# Patient Record
Sex: Female | Born: 1950 | Hispanic: Yes | Marital: Single | Smoking: Never smoker
Health system: Southern US, Community
[De-identification: ages and names within clinical notes are randomized; demographics above are authoritative.]

---

## 2007-03-23 ENCOUNTER — Ambulatory Visit: Payer: Self-pay

## 2008-05-23 ENCOUNTER — Ambulatory Visit: Payer: Self-pay

## 2008-07-02 ENCOUNTER — Ambulatory Visit: Payer: Self-pay

## 2009-08-06 ENCOUNTER — Ambulatory Visit: Payer: Self-pay

## 2010-07-04 IMAGING — MG MAM [PERSON_NAME] DIG SCREEN W/CAD
1 series · 4 of 4 positions shown · non-contrast
Comparison: none

REASON FOR EXAM: annual screening
COMMENTS:

PROCEDURE:     MAM - MAM [REDACTED] AUJLA DIG SCREEN W/CAD  - May 23, 2008 [DATE]
RESULT:       Prominent retroareolar tissue is noted about the LEFT breast
for which compression spot films and if need be ultrasound suggested for
further evaluation. No other abnormalities are identified.

[R CC · right · 4 of 4 slices shown]
[im 1/4]
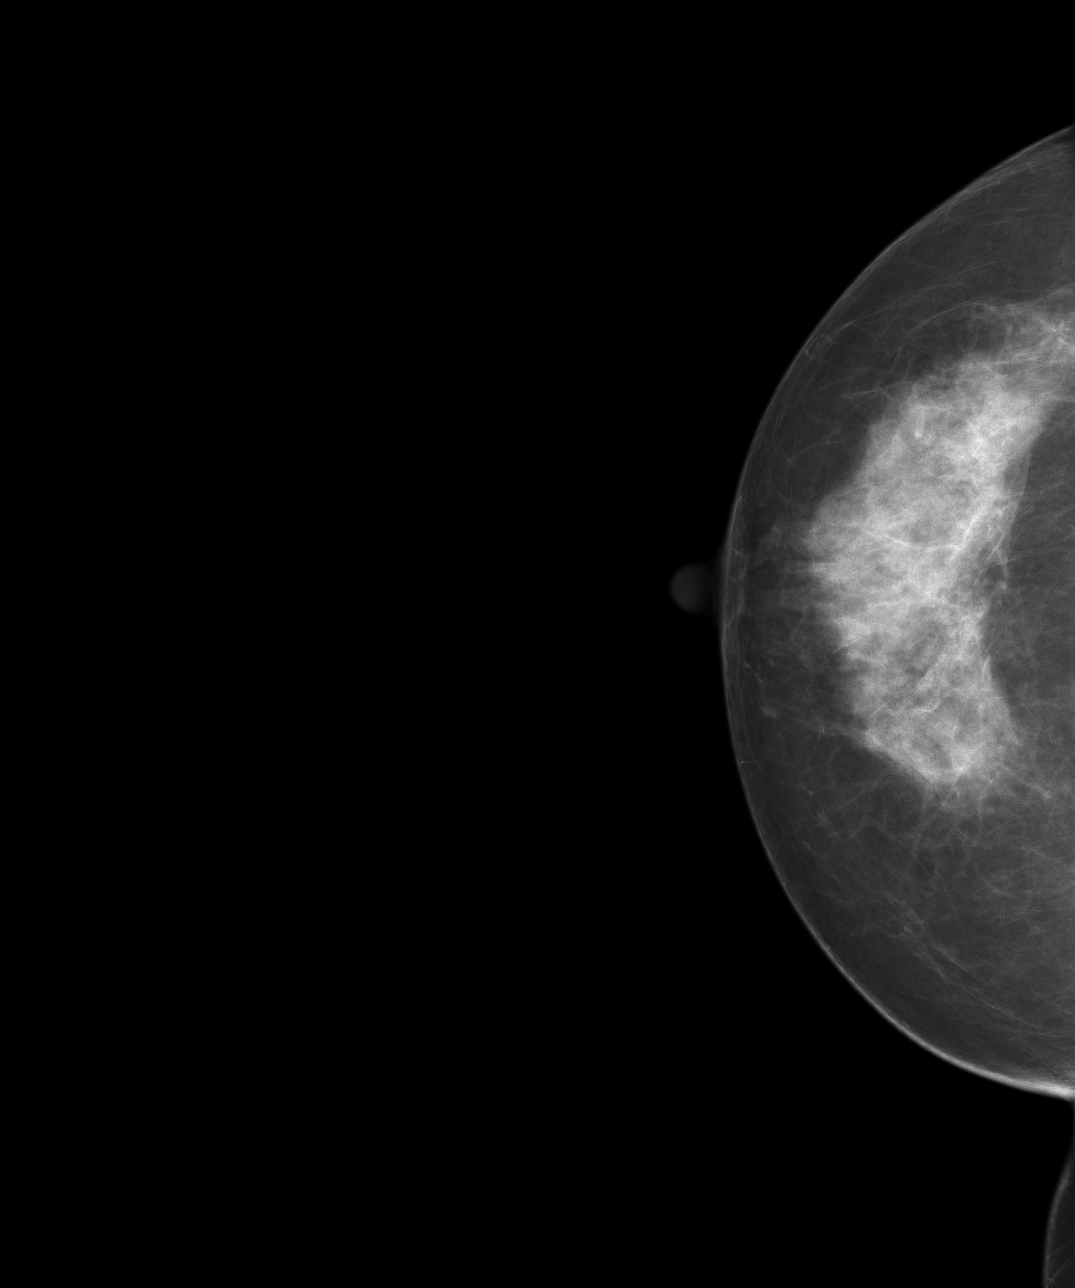
[im 2/4]
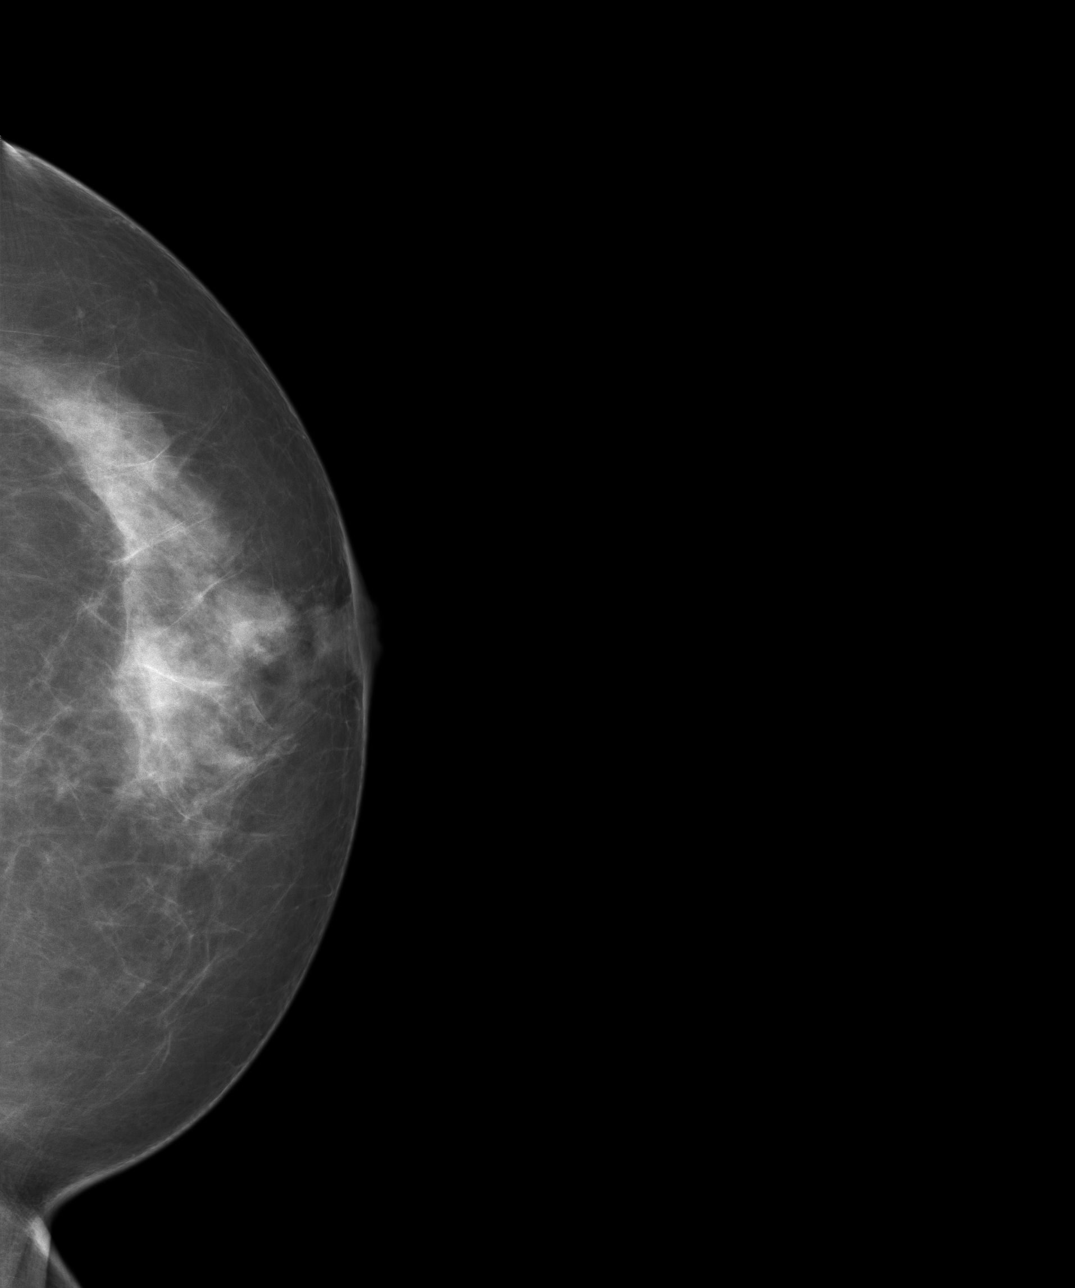
[im 3/4]
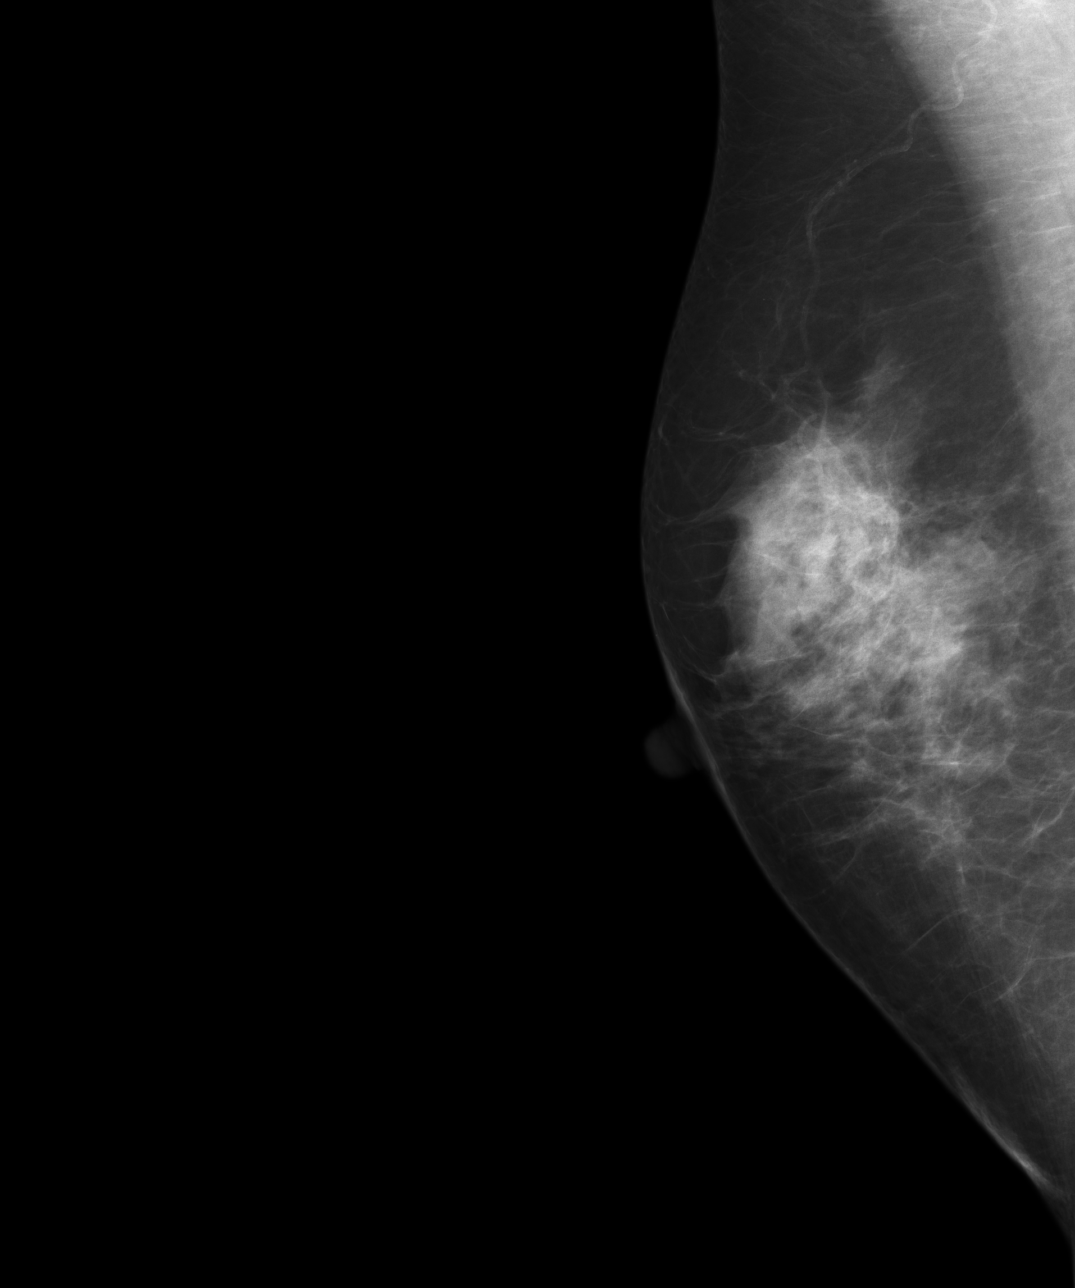
[im 4/4]
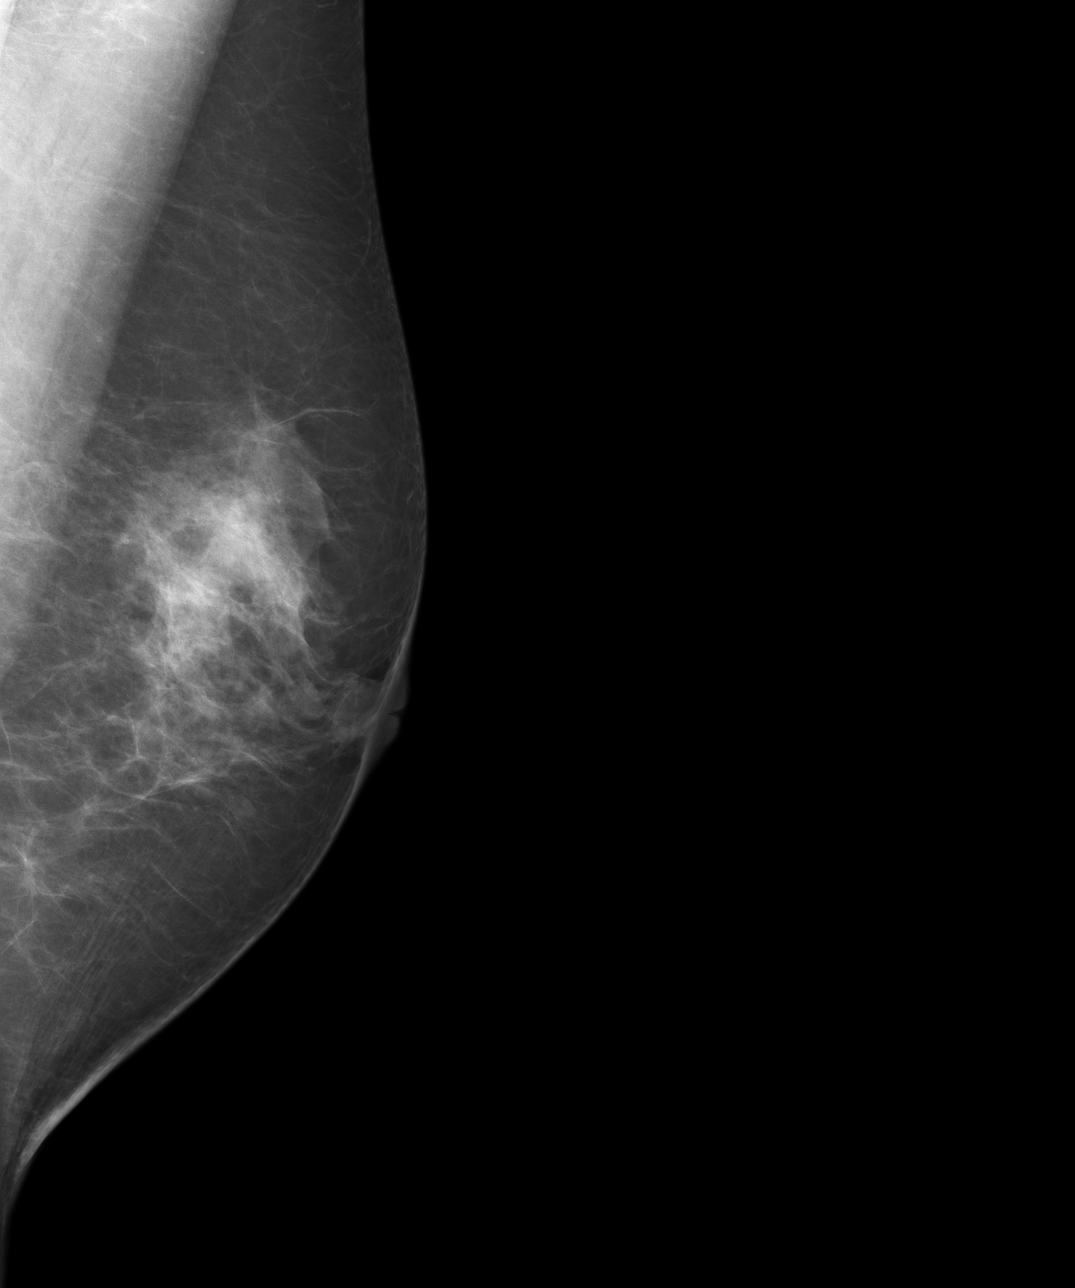

[4 of 4 positions shown; findings below may reference images not displayed]

IMPRESSION: 1.     Prominent retroareolar parenchymal tissue noted on the LEFT for which
compression spot films and if need be ultrasound suggested for further
evaluation.
2.     BI-RADS:  Category 0-Needs Additional Imaging Evaluation.

A NEGATIVE MAMMOGRAM REPORT DOES NOT PRECLUDE BIOPSY OR OTHER EVALUATION OF
A CLINICALLY PALPABLE OR OTHERWISE SUSPICIOUS MASS OR LESION.  BREAST CANCER
MAY NOT BE DETECTED BY MAMMOGRAPHY IN UP TO 10% OF CASES.

## 2011-01-09 ENCOUNTER — Ambulatory Visit: Payer: Self-pay | Admitting: Family Medicine

## 2012-01-26 ENCOUNTER — Ambulatory Visit: Payer: Self-pay

## 2015-07-29 ENCOUNTER — Other Ambulatory Visit: Payer: Self-pay | Admitting: Family Medicine

## 2015-07-29 DIAGNOSIS — Z1231 Encounter for screening mammogram for malignant neoplasm of breast: Secondary | ICD-10-CM

## 2015-08-08 ENCOUNTER — Ambulatory Visit
Admission: RE | Admit: 2015-08-08 | Discharge: 2015-08-08 | Disposition: A | Discharge status: Home / Self Care | Payer: Self-pay | Source: Ambulatory Visit | Attending: Family Medicine | Admitting: Family Medicine

## 2015-08-08 DIAGNOSIS — Z1231 Encounter for screening mammogram for malignant neoplasm of breast: Secondary | ICD-10-CM

## 2016-06-29 ENCOUNTER — Other Ambulatory Visit: Payer: Self-pay | Admitting: Primary Care

## 2016-06-29 DIAGNOSIS — Z1231 Encounter for screening mammogram for malignant neoplasm of breast: Secondary | ICD-10-CM

## 2016-08-11 ENCOUNTER — Ambulatory Visit
Admission: RE | Admit: 2016-08-11 | Discharge: 2016-08-11 | Disposition: A | Discharge status: Home / Self Care | Payer: Self-pay | Source: Ambulatory Visit | Attending: Primary Care | Admitting: Primary Care

## 2016-08-11 DIAGNOSIS — Z1231 Encounter for screening mammogram for malignant neoplasm of breast: Secondary | ICD-10-CM

## 2017-07-27 ENCOUNTER — Other Ambulatory Visit: Payer: Self-pay | Admitting: Family Medicine

## 2017-07-27 DIAGNOSIS — Z1231 Encounter for screening mammogram for malignant neoplasm of breast: Secondary | ICD-10-CM

## 2017-08-20 ENCOUNTER — Ambulatory Visit
Admission: RE | Admit: 2017-08-20 | Discharge: 2017-08-20 | Disposition: A | Discharge status: Home / Self Care | Payer: Self-pay | Source: Ambulatory Visit | Attending: Family Medicine | Admitting: Family Medicine

## 2017-08-20 DIAGNOSIS — Z1231 Encounter for screening mammogram for malignant neoplasm of breast: Secondary | ICD-10-CM

## 2018-09-05 ENCOUNTER — Other Ambulatory Visit: Payer: Self-pay | Admitting: Family Medicine

## 2018-09-05 DIAGNOSIS — Z1231 Encounter for screening mammogram for malignant neoplasm of breast: Secondary | ICD-10-CM

## 2018-09-30 ENCOUNTER — Ambulatory Visit
Admission: RE | Admit: 2018-09-30 | Discharge: 2018-09-30 | Disposition: A | Discharge status: Home / Self Care | Payer: Self-pay | Source: Ambulatory Visit | Attending: Family Medicine | Admitting: Family Medicine

## 2018-09-30 DIAGNOSIS — Z1231 Encounter for screening mammogram for malignant neoplasm of breast: Secondary | ICD-10-CM | POA: Insufficient documentation

## 2020-04-04 ENCOUNTER — Other Ambulatory Visit: Payer: Self-pay | Admitting: Family Medicine

## 2020-04-04 DIAGNOSIS — Z1231 Encounter for screening mammogram for malignant neoplasm of breast: Secondary | ICD-10-CM

## 2020-04-08 ENCOUNTER — Other Ambulatory Visit: Payer: Self-pay

## 2020-04-08 ENCOUNTER — Ambulatory Visit
Admission: RE | Admit: 2020-04-08 | Discharge: 2020-04-08 | Disposition: A | Discharge status: Home / Self Care | Payer: Self-pay | Source: Ambulatory Visit | Attending: Family Medicine | Admitting: Family Medicine

## 2020-04-08 DIAGNOSIS — Z1231 Encounter for screening mammogram for malignant neoplasm of breast: Secondary | ICD-10-CM | POA: Insufficient documentation

## 2020-12-06 ENCOUNTER — Other Ambulatory Visit: Payer: Self-pay | Admitting: Family Medicine

## 2020-12-06 DIAGNOSIS — Z1231 Encounter for screening mammogram for malignant neoplasm of breast: Secondary | ICD-10-CM

## 2021-04-11 ENCOUNTER — Ambulatory Visit
Admission: RE | Admit: 2021-04-11 | Discharge: 2021-04-11 | Disposition: A | Discharge status: Home / Self Care | Payer: Self-pay | Source: Ambulatory Visit | Attending: Family Medicine | Admitting: Family Medicine

## 2021-04-11 ENCOUNTER — Other Ambulatory Visit: Payer: Self-pay

## 2021-04-11 DIAGNOSIS — Z1231 Encounter for screening mammogram for malignant neoplasm of breast: Secondary | ICD-10-CM | POA: Insufficient documentation

## 2021-10-17 ENCOUNTER — Other Ambulatory Visit: Payer: Self-pay

## 2021-10-17 ENCOUNTER — Emergency Department
Admission: EM | Admit: 2021-10-17 | Discharge: 2021-10-17 | Disposition: A | Discharge status: Home / Self Care | Payer: Self-pay | Attending: Emergency Medicine | Admitting: Emergency Medicine

## 2021-10-17 ENCOUNTER — Emergency Department: Payer: Self-pay

## 2021-10-17 DIAGNOSIS — I7100 Dissection of unspecified site of aorta: Secondary | ICD-10-CM | POA: Insufficient documentation

## 2021-10-17 DIAGNOSIS — R1010 Upper abdominal pain, unspecified: Secondary | ICD-10-CM | POA: Insufficient documentation

## 2021-10-17 DIAGNOSIS — Z20822 Contact with and (suspected) exposure to covid-19: Secondary | ICD-10-CM | POA: Insufficient documentation

## 2021-10-17 DIAGNOSIS — R531 Weakness: Secondary | ICD-10-CM | POA: Insufficient documentation

## 2021-10-17 LAB — COMPREHENSIVE METABOLIC PANEL
ALT: 17 U/L (ref 0–44)
AST: 21 U/L (ref 15–41)
Albumin: 3.8 g/dL (ref 3.5–5.0)
Alkaline Phosphatase: 107 U/L (ref 38–126)
Anion gap: 9 (ref 5–15)
BUN: 8 mg/dL (ref 8–23)
CO2: 24 mmol/L (ref 22–32)
Calcium: 8.8 mg/dL — ABNORMAL LOW (ref 8.9–10.3)
Chloride: 106 mmol/L (ref 98–111)
Creatinine, Ser: 0.4 mg/dL — ABNORMAL LOW (ref 0.44–1.00)
GFR, Estimated: >60 mL/min (ref >60)
Glucose, Bld: 127 mg/dL — ABNORMAL HIGH (ref 70–99)
Potassium: 3.3 mmol/L — ABNORMAL LOW (ref 3.5–5.1)
Sodium: 139 mmol/L (ref 135–145)
Total Bilirubin: 0.4 mg/dL (ref 0.3–1.2)
Total Protein: 6.5 g/dL (ref 6.5–8.1)

## 2021-10-17 LAB — PROTIME-INR
INR: 1 (ref 0.8–1.2)
Prothrombin Time: 13.2 seconds (ref 11.4–15.2)

## 2021-10-17 LAB — RESP PANEL BY RT-PCR (FLU A&B, COVID) ARPGX2
Influenza A by PCR: NEGATIVE
Influenza B by PCR: NEGATIVE
SARS Coronavirus 2 by RT PCR: NEGATIVE

## 2021-10-17 LAB — CBC WITH DIFFERENTIAL/PLATELET
Abs Immature Granulocytes: 0.03 10*3/uL (ref 0.00–0.07)
Basophils Absolute: 0 10*3/uL (ref 0.0–0.1)
Basophils Relative: 0 %
Eosinophils Absolute: 0.1 10*3/uL (ref 0.0–0.5)
Eosinophils Relative: 1 %
HCT: 40.9 % (ref 36.0–46.0)
Hemoglobin: 13.6 g/dL (ref 12.0–15.0)
Immature Granulocytes: 0 %
Lymphocytes Relative: 50 %
Lymphs Abs: 4.7 10*3/uL — ABNORMAL HIGH (ref 0.7–4.0)
MCH: 31.4 pg (ref 26.0–34.0)
MCHC: 33.3 g/dL (ref 30.0–36.0)
MCV: 94.5 fL (ref 80.0–100.0)
Monocytes Absolute: 0.9 10*3/uL (ref 0.1–1.0)
Monocytes Relative: 9 %
Neutro Abs: 3.8 10*3/uL (ref 1.7–7.7)
Neutrophils Relative %: 40 %
Platelets: 277 10*3/uL (ref 150–400)
RBC: 4.33 MIL/uL (ref 3.87–5.11)
RDW: 11.9 % (ref 11.5–15.5)
WBC: 9.5 10*3/uL (ref 4.0–10.5)
nRBC: 0 % (ref 0.0–0.2)

## 2021-10-17 LAB — TYPE AND SCREEN
ABO/RH(D): A POS
Antibody Screen: NEGATIVE

## 2021-10-17 LAB — TROPONIN I (HIGH SENSITIVITY)
Troponin I (High Sensitivity): 3 ng/L (ref <18)
Troponin I (High Sensitivity): 3 ng/L (ref <18)

## 2021-10-17 LAB — CBG MONITORING, ED: Glucose-Capillary: 105 mg/dL — ABNORMAL HIGH (ref 70–99)

## 2021-10-17 LAB — LIPASE, BLOOD: Lipase: 30 U/L (ref 11–51)

## 2021-10-17 MED ORDER — IOHEXOL 350 MG/ML SOLN
80.0000 mL | Freq: Once | INTRAVENOUS | Status: AC | PRN
  Administered 2021-10-17: 80 mL via INTRAVENOUS

## 2021-10-17 MED ORDER — ONDANSETRON HCL 4 MG/2ML IJ SOLN
4.0000 mg | Freq: Once | INTRAMUSCULAR | Status: AC
  Administered 2021-10-17: 4 mg via INTRAVENOUS
  Filled 2021-10-17: qty 2

## 2021-10-17 MED ORDER — FENTANYL CITRATE PF 50 MCG/ML IJ SOSY
50.0000 ug | PREFILLED_SYRINGE | Freq: Once | INTRAMUSCULAR | Status: AC
  Administered 2021-10-17: 50 ug via INTRAVENOUS
  Filled 2021-10-17: qty 1

## 2021-10-17 MED ORDER — POTASSIUM CHLORIDE CRYS ER 20 MEQ PO TBCR
40.0000 meq | EXTENDED_RELEASE_TABLET | Freq: Once | ORAL | Status: AC
  Administered 2021-10-17: 40 meq via ORAL
  Filled 2021-10-17: qty 2

## 2021-10-17 MED ORDER — PANTOPRAZOLE 80MG IVPB - SIMPLE MED
80.0000 mg | Freq: Once | INTRAVENOUS | Status: AC
  Administered 2021-10-17: 80 mg via INTRAVENOUS
  Filled 2021-10-17: qty 100

## 2021-10-17 MED ORDER — LACTATED RINGERS IV SOLN: INTRAVENOUS | Status: DC

## 2021-10-17 MED ORDER — LIDOCAINE VISCOUS HCL 2 % MT SOLN
15.0000 mL | Freq: Once | OROMUCOSAL | Status: AC
  Administered 2021-10-17: 15 mL via ORAL
  Filled 2021-10-17: qty 15

## 2021-10-17 MED ORDER — ALUM & MAG HYDROXIDE-SIMETH 200-200-20 MG/5ML PO SUSP
30.0000 mL | Freq: Once | ORAL | Status: AC
Start: 2021-10-17 — End: 2021-10-17
  Administered 2021-10-17: 30 mL via ORAL
  Filled 2021-10-17: qty 30

## 2021-10-17 NOTE — Discharge Instructions (Signed)
As we discussed you need to follow up with your doctor to evaluate your thyroid nodule and possibly have an MRI of your liver

## 2021-10-17 NOTE — ED Notes (Signed)
Pt given potassium at 8:17 am. The pt was unable to swallow pills so meds were crushed.

## 2021-10-17 NOTE — ED Triage Notes (Addendum)
Pt here for c/o epigastric abdominal pain and woke up today with weakness.  Denies fever, diarrhea at this time. Reports PMHX hyperlipidemia.  Glucose=105 on arrival .  Pt presents to ED AA, spanish speaking. Appears pale looking, diaphoretic and weak. ERMD at bedside at this time . [employer] Interpreter [phone]

## 2021-10-17 NOTE — ED Provider Notes (Signed)
[facility] Provider Note    Event Date/Time   First MD Initiated Contact with Patient 10/17/21 [phone]     (approximate)   History   Abdominal Pain and Weakness   HPI  Monica Bryant is a 71 y.o. female with history of hyperlipidemia, previous cholecystectomy who presents to the emergency department with her son with complaints of severe onset upper abdominal pain that started this morning.  She is feeling very weak as well.  No fevers, nausea, vomiting, diarrhea, bloody stools, melena.  No vaginal bleeding, dysuria or hematuria.  Has never had similar symptoms.  Denies chest pain or shortness of breath.  Lives with her son.   History provided by patient and son using Spanish interpreter.    No past medical history on file.  No past surgical history on file.  MEDICATIONS:  Prior to Admission medications   Not on File    Physical Exam   Triage Vital Signs: ED Triage Vitals  Enc Vitals Group     BP 10/17/21 0559 (!) 118/54     Pulse Rate 10/17/21 0559 69     Resp 10/17/21 0559 15     Temp 10/17/21 0601 97.8 F (36.6 C)     Temp Source 10/17/21 0601 Oral     SpO2 10/17/21 0559 96 %     Weight 10/17/21 0602 122 lb 12.7 oz (55.7 kg)     Height 10/17/21 0602 5' (1.524 m)     Head Circumference --      Peak Flow --      Pain Score --      Pain Loc --      Pain Edu? --      Excl. in [place]? --     Most recent vital signs: Vitals:   10/17/21 0601 10/17/21 0630  BP:  113/70  Pulse:  64  Resp:  12  Temp: 97.8 F (36.6 C)   SpO2:  100%    CONSTITUTIONAL: Alert and oriented and responds appropriately to questions.  Elderly, pale, appears uncomfortable, slightly diaphoretic HEAD: Normocephalic, atraumatic EYES: Conjunctivae clear, pupils appear equal, sclera nonicteric, patient has conjunctival pallor ENT: normal nose; moist mucous membranes NECK: Supple, normal ROM CARD: RRR; S1 and S2 appreciated; no murmurs, no clicks, no rubs, no  gallops RESP: Normal chest excursion without splinting or tachypnea; breath sounds clear and equal bilaterally; no wheezes, no rhonchi, no rales, no hypoxia or respiratory distress, speaking full sentences ABD/GI: Normal bowel sounds; non-distended; soft, tender to palpation in the upper abdomen without guarding or rebound RECTAL:  Normal rectal tone, no gross blood or melena, guaiac NEGATIVE, stool appears normal brown color, no hemorrhoids appreciated, nontender rectal exam, no fecal impaction. Chaperone present. BACK: The back appears normal EXT: Normal ROM in all joints; no deformity noted, no edema; no cyanosis, no calf tenderness or calf swelling, 2+ DP and radial pulses bilaterally SKIN: Normal color for age and race; warm; no rash on exposed skin NEURO: Moves all extremities equally, normal speech PSYCH: The patient's mood and manner are appropriate.   ED Results / Procedures / Treatments   LABS: (all labs ordered are listed, but only abnormal results are displayed) Labs Reviewed  CBC WITH DIFFERENTIAL/PLATELET - Abnormal; Notable for the following components:      Result Value   Lymphs Abs 4.7 (*)    All other components within normal limits  COMPREHENSIVE METABOLIC PANEL - Abnormal; Notable for the following components:   Potassium 3.3 (*)  Glucose, Bld 127 (*)    Creatinine, Ser 0.40 (*)    Calcium 8.8 (*)    All other components within normal limits  CBG MONITORING, ED - Abnormal; Notable for the following components:   Glucose-Capillary 105 (*)    All other components within normal limits  RESP PANEL BY RT-PCR (FLU A&B, COVID) ARPGX2  LIPASE, BLOOD  PROTIME-INR  TYPE AND SCREEN  TROPONIN I (HIGH SENSITIVITY)     EKG:  EKG Interpretation  Date/Time:  Friday October 17 2021 06:02:13 EST Ventricular Rate:  64 PR Interval:  163 QRS Duration: 100 QT Interval:  408 QTC Calculation: 421 R Axis:   70 Text Interpretation: Sinus rhythm Abnormal R-wave progression,  early transition Confirmed by Pryor Curia [phone]) on 10/17/2021 6:29:48 AM         RADIOLOGY: My personal review and interpretation of imaging: CT dissection study pending.  I have personally reviewed all radiology reports.   No results found.   PROCEDURES:  Critical Care performed: No   CRITICAL CARE Performed by: Pryor Curia   Total critical care time: 0 minutes  Critical care time was exclusive of separately billable procedures and treating other patients.  Critical care was necessary to treat or prevent imminent or life-threatening deterioration.  Critical care was time spent personally by me on the following activities: development of treatment plan with patient and/or surrogate as well as nursing, discussions with consultants, evaluation of patient's response to treatment, examination of patient, obtaining history from patient or surrogate, ordering and performing treatments and interventions, ordering and review of laboratory studies, ordering and review of radiographic studies, pulse oximetry and re-evaluation of patient's condition.   Marland Kitchen1-3 Lead EKG Interpretation Performed by: Nafeesah Lapaglia, Delice Bison, DO Authorized by: Ajamu Maxon, Delice Bison, DO     Interpretation: normal     ECG rate:  75   ECG rate assessment: normal     Rhythm: sinus rhythm     Ectopy: none     Conduction: normal      IMPRESSION / MDM / ASSESSMENT AND PLAN / ED COURSE  I reviewed the triage vital signs and the nursing notes.    Patient here with upper abdominal pain.  Appears pale and uncomfortable on exam.  The patient is on the cardiac monitor to evaluate for evidence of arrhythmia and/or significant heart rate changes.   DIFFERENTIAL DIAGNOSIS (includes but not limited to):   Dissection, peptic ulcer disease, GI bleed, anemia, ACS, PE, colitis, GERD, duodenitis, choledocholithiasis, pancreatitis   PLAN: We will obtain CBC, coags, CMP, lipase, troponin.  EKG nonischemic.  She is guaiac  negative here.  Will obtain CT dissection study.  Will give Protonix, fentanyl, Zofran, IV fluids.  Patient on cardiac monitoring.   MEDICATIONS GIVEN IN ED: Medications  lactated ringers infusion ( Intravenous New Bag/Given 10/17/21 0618)  potassium chloride SA (KLOR-CON M) CR tablet 40 mEq (has no administration in time range)  pantoprazole (PROTONIX) 80 mg /NS 100 mL IVPB (0 mg Intravenous Stopped 10/17/21 0649)  fentaNYL (SUBLIMAZE) injection 50 mcg (50 mcg Intravenous Given 10/17/21 0618)  ondansetron (ZOFRAN) injection 4 mg (4 mg Intravenous Given 10/17/21 0618)  alum & mag hydroxide-simeth (MAALOX/MYLANTA) 200-200-20 MG/5ML suspension 30 mL (30 mLs Oral Given 10/17/21 0649)    And  lidocaine (XYLOCAINE) 2 % viscous mouth solution 15 mL (15 mLs Oral Given 10/17/21 0649)  iohexol (OMNIPAQUE) 350 MG/ML injection 80 mL (80 mLs Intravenous Contrast Given 10/17/21 0704)     ED COURSE: Patient's hemoglobin  is normal.  Potassium slightly low at 3.3.  Will replace.  Normal LFTs, lipase.  Troponin x 1 negative.  COVID and flu swabs negative.  CT dissection study pending.  Signed out the oncoming ED physician.  I reviewed all nursing notes and pertinent previous records as available.  I have reviewed and interpreted any and all EKGs, lab and urine results, imaging and radiology reports (as available).    CONSULTS: Dispo pending once results complete.   OUTSIDE RECORDS REVIEWED: Reviewed patient's colonoscopy at [facility] on 03/12/2021.  Patient was found to have internal hemorrhoids.  She had 25 subcentimeter polyps throughout the colon that were all resected.         FINAL CLINICAL IMPRESSION(S) / ED DIAGNOSES   Final diagnoses:  Upper abdominal pain     Rx / DC Orders   ED Discharge Orders     None        Note:  This document was prepared using Dragon voice recognition software and may include unintentional dictation errors.   Lamonda Noxon, Delice Bison, DO 10/17/21 [phone]

## 2021-10-17 NOTE — ED Notes (Addendum)
[employer] language interpreter Sol Blazing [phone] utilized to inform and explain to pt and family at bedside medications given

## 2021-10-17 NOTE — ED Provider Notes (Signed)
Patient CT scan is reassuring, discussed thyroid nodule and need for outpatient liver MRI with the patient and family using interpreter  She is asymptomatic and feeling well at this time, appropriate for discharge   Jene Every, MD 10/17/21 [phone]

## 2021-10-17 NOTE — ED Notes (Signed)
Rounded to see patient. Used interpreter on stick to introduce self and plan for the morning. Pt CAOx4 and in no distress. Family at bedside.

## 2021-11-13 ENCOUNTER — Encounter (HOSPITAL_COMMUNITY): Payer: Self-pay | Admitting: Radiology

## 2022-07-22 ENCOUNTER — Other Ambulatory Visit: Payer: Self-pay

## 2022-07-22 DIAGNOSIS — Z1231 Encounter for screening mammogram for malignant neoplasm of breast: Secondary | ICD-10-CM

## 2022-07-27 ENCOUNTER — Ambulatory Visit
Admission: RE | Admit: 2022-07-27 | Discharge: 2022-07-27 | Disposition: A | Discharge status: Home / Self Care | Payer: Self-pay | Source: Ambulatory Visit | Attending: Obstetrics and Gynecology | Admitting: Obstetrics and Gynecology

## 2022-07-27 ENCOUNTER — Ambulatory Visit: Payer: Self-pay | Attending: Hematology and Oncology | Admitting: Hematology and Oncology

## 2022-07-27 VITALS — BP 154/72 | Wt 117.3 lb

## 2022-07-27 DIAGNOSIS — Z1231 Encounter for screening mammogram for malignant neoplasm of breast: Secondary | ICD-10-CM

## 2022-07-27 NOTE — Progress Notes (Signed)
Monica Bryant is a 71 y.o. female who presents to [facility] clinic today with no complaints.    Pap Smear: Pap not smear completed today. Last Pap smear was 2017 at [facility] clinic and was normal. Per patient has no history of an abnormal Pap smear. Last Pap smear result is available in Epic. She declines Pap today; due to age, wishes to not have anymore.    Physical exam: Breasts Breasts symmetrical. No skin abnormalities bilateral breasts. No nipple retraction bilateral breasts. No nipple discharge bilateral breasts. No lymphadenopathy. No lumps palpated bilateral breasts.     MM 3D SCREEN BREAST BILATERAL  Result Date: 04/14/2021 CLINICAL DATA:  Screening. EXAM: DIGITAL SCREENING BILATERAL MAMMOGRAM WITH TOMOSYNTHESIS AND CAD TECHNIQUE: Bilateral screening digital craniocaudal and mediolateral oblique mammograms were obtained. Bilateral screening digital breast tomosynthesis was performed. The images were evaluated with computer-aided detection. COMPARISON:  Previous exam(s). ACR Breast Density Category c: The breast tissue is heterogeneously dense, which may obscure small masses. FINDINGS: There are no findings suspicious for malignancy. IMPRESSION: No mammographic evidence of malignancy. A result letter of this screening mammogram will be mailed directly to the patient. RECOMMENDATION: Screening mammogram in one year. (Code:SM-B-01Y) BI-RADS CATEGORY  1: Negative. Electronically Signed   By: Baird Lyons M.D.   On: 04/14/2021 13:29  MM 3D SCREEN BREAST BILATERAL  Result Date: 04/09/2020 CLINICAL DATA:  Screening. EXAM: DIGITAL SCREENING BILATERAL MAMMOGRAM WITH TOMO AND CAD COMPARISON:  Previous exam(s). ACR Breast Density Category c: The breast tissue is heterogeneously dense, which may obscure small masses. FINDINGS: There are no findings suspicious for malignancy. Images were processed with CAD. IMPRESSION: No mammographic evidence of malignancy. A result letter of this screening  mammogram will be mailed directly to the patient. RECOMMENDATION: Screening mammogram in one year. (Code:SM-B-01Y) BI-RADS CATEGORY  1: Negative. Electronically Signed   By: Emmaline Kluver M.D.   On: 04/09/2020 14:04   MM 3D SCREEN BREAST BILATERAL  Result Date: 09/30/2018 CLINICAL DATA:  Screening. EXAM: DIGITAL SCREENING BILATERAL MAMMOGRAM WITH TOMO AND CAD COMPARISON:  Previous exam(s). ACR Breast Density Category b: There are scattered areas of fibroglandular density. FINDINGS: There are no findings suspicious for malignancy. Images were processed with CAD. IMPRESSION: No mammographic evidence of malignancy. A result letter of this screening mammogram will be mailed directly to the patient. RECOMMENDATION: Screening mammogram in one year. (Code:SM-B-01Y) BI-RADS CATEGORY  1: Negative. Electronically Signed   By: Ted Mcalpine M.D.   On: 09/30/2018 13:08   MM DIGITAL SCREENING BILATERAL  Result Date: 08/23/2017 CLINICAL DATA:  Screening. EXAM: DIGITAL SCREENING BILATERAL MAMMOGRAM WITH CAD COMPARISON:  Previous exam(s). ACR Breast Density Category c: The breast tissue is heterogeneously dense, which may obscure small masses. FINDINGS: There are no findings suspicious for malignancy. Images were processed with CAD. IMPRESSION: No mammographic evidence of malignancy. A result letter of this screening mammogram will be mailed directly to the patient. RECOMMENDATION: Screening mammogram in one year. (Code:SM-B-01Y) BI-RADS CATEGORY  1: Negative. Electronically Signed   By: Ted Mcalpine M.D.   On: 08/23/2017 17:26      Pelvic/Bimanual Pap is not indicated today    Smoking History: Patient has never smoked and was not referred to quit line.    Patient Navigation: Patient education provided. Access to services provided for patient through [facility] program. Karolee Stamps interpreter provided. No transportation provided   Colorectal Cancer Screening: Per patient has had colonoscopy  completed on 2022 with no evidence of high-grade dysplasia on polypectomy.  No complaints today.    Breast and Cervical Cancer Risk Assessment: Patient does not have family history of breast cancer, known genetic mutations, or radiation treatment to the chest before age 47. Patient does not have history of cervical dysplasia, immunocompromised, or DES exposure in-utero.  Risk Assessment   No risk assessment data     A: [facility] exam without pap smear No complaints with benign exam.   P: [facility]. Appointment scheduled 07/27/22.  Pascal Lux, NP 07/27/2022 9:53 AM

## 2022-07-27 NOTE — Patient Instructions (Signed)
Taught Monica Bryant about self breast awareness. Patient did not need a Pap smear today due to last Pap smear was in 2017 per patient; she declines anymore Paps due to age and discomfort. [facility]. Appointment scheduled for 07/27/22. Patient aware of appointment and will be there. Let patient know will follow up with her within the next couple weeks with results. Monica Bryant [place] verbalized understanding.  Monica Lux, NP 9:56 AM

## 2023-06-18 ENCOUNTER — Telehealth: Payer: Self-pay | Admitting: *Deleted

## 2023-07-02 ENCOUNTER — Other Ambulatory Visit: Payer: Self-pay | Admitting: Family Medicine

## 2023-07-02 DIAGNOSIS — Z1231 Encounter for screening mammogram for malignant neoplasm of breast: Secondary | ICD-10-CM

## 2023-07-29 ENCOUNTER — Ambulatory Visit
Admission: RE | Admit: 2023-07-29 | Discharge: 2023-07-29 | Disposition: A | Discharge status: Home / Self Care | Payer: Self-pay | Source: Ambulatory Visit | Attending: Family Medicine | Admitting: Family Medicine

## 2023-07-29 DIAGNOSIS — Z1231 Encounter for screening mammogram for malignant neoplasm of breast: Secondary | ICD-10-CM | POA: Insufficient documentation

## 2023-11-28 IMAGING — CT CT ANGIO CHEST-ABD-PELV FOR DISSECTION W/ AND WO/W CM
2 of 7 series · 14 of 46 positions shown, 16 images · IV contrast (APPLIED)
Comparison: None available

CLINICAL DATA: Severe upper abdominal pain that started this
morning, acute aortic syndrome suspected.

EXAM:
CT ANGIOGRAPHY CHEST, ABDOMEN AND PELVIS
TECHNIQUE: Non-contrast CT of the chest was initially obtained.

[Series 5: axial arterial · axial · arterial · 0.84mm/px · z∈[-628,-68]mm · 11 of 215 slices shown, 13 images]
[im 14/215  soft-tissue]
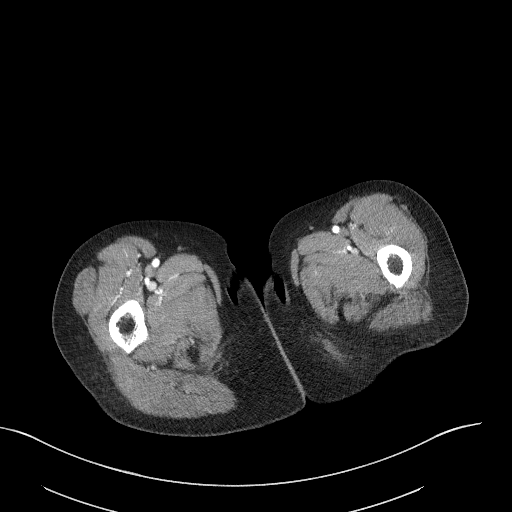
[im 14/215  bone]
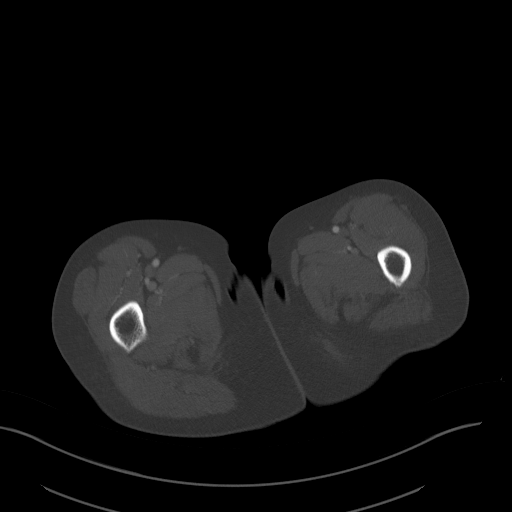
[im 41/215  soft-tissue]
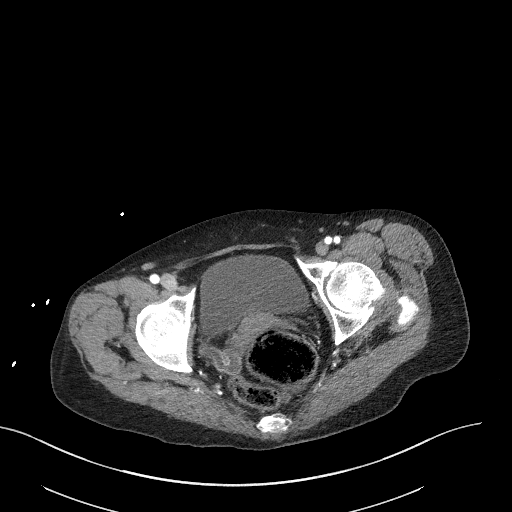
[im 54/215  soft-tissue]
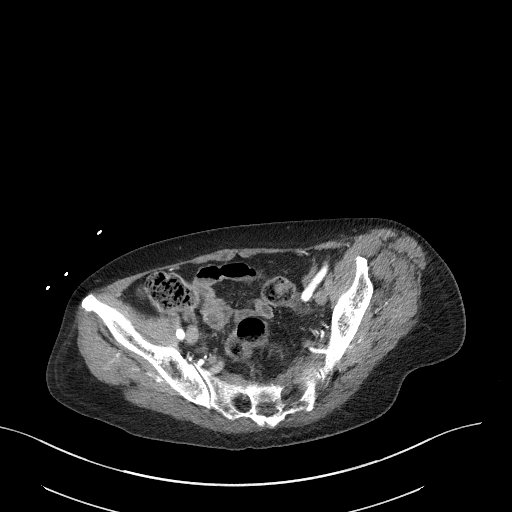
[im 67/215  soft-tissue]
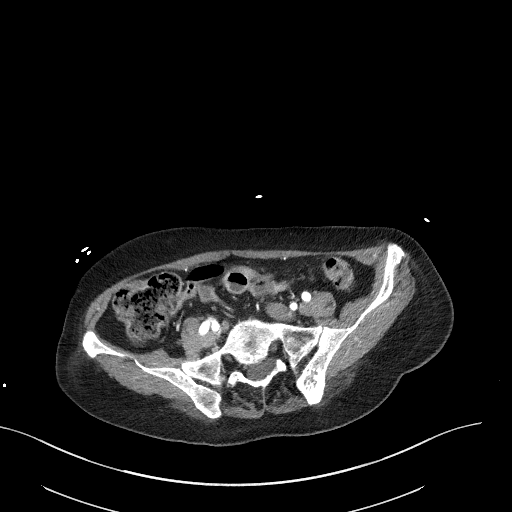
[im 94/215  soft-tissue]
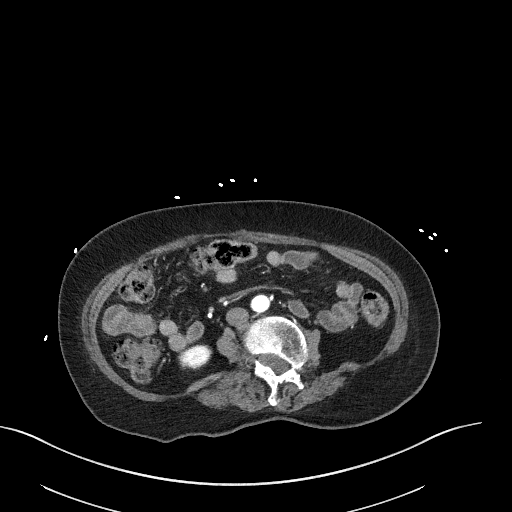
[im 108/215  soft-tissue]
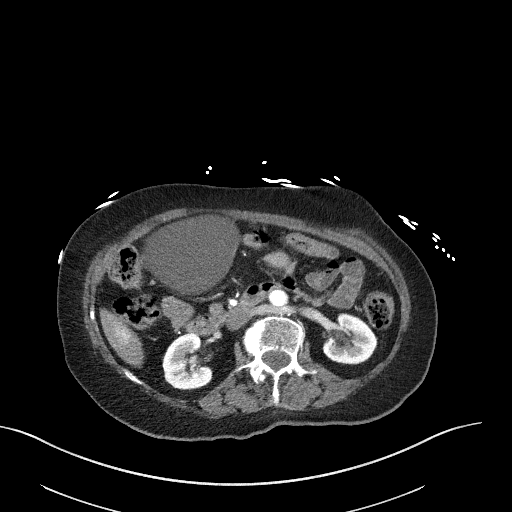
[im 121/215  soft-tissue]
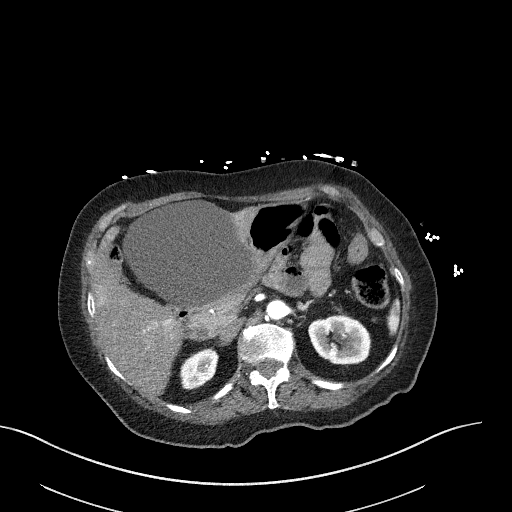
[im 148/215  soft-tissue]
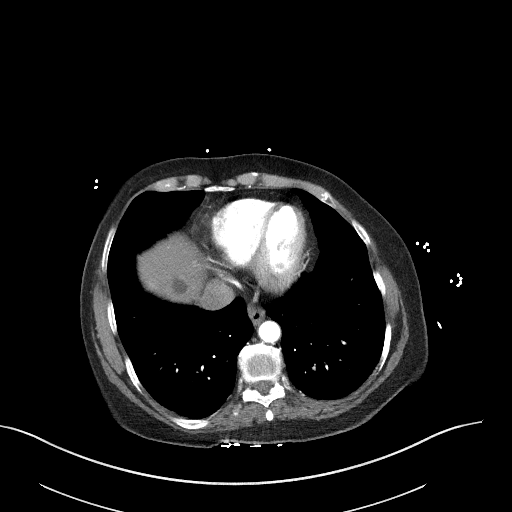
[im 161/215  soft-tissue]
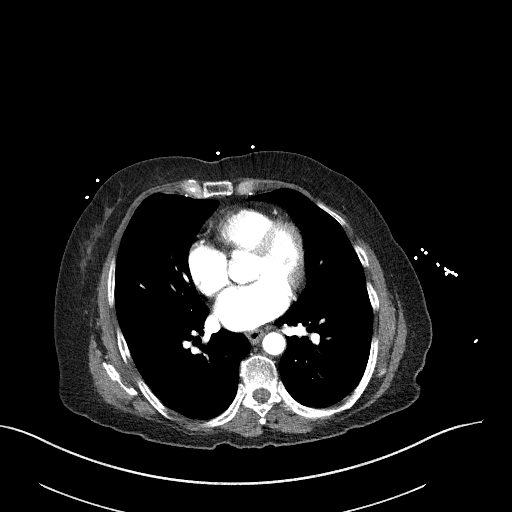
[im 161/215  bone]
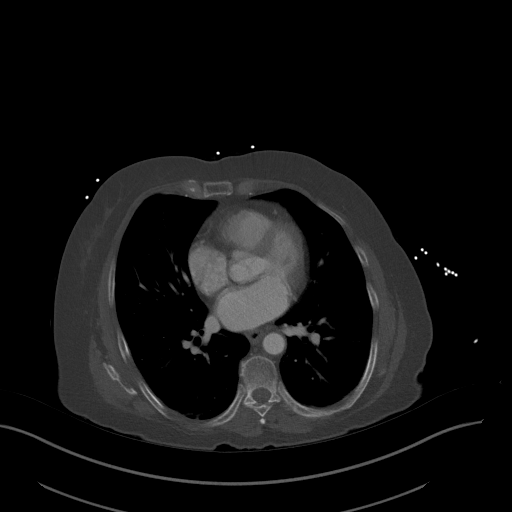
[im 174/215  soft-tissue]
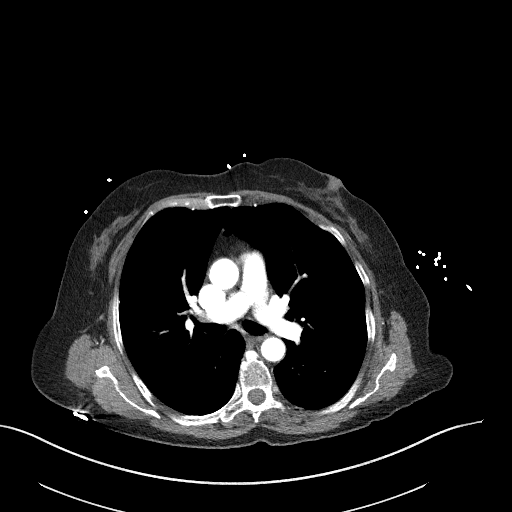
[im 201/215  soft-tissue]
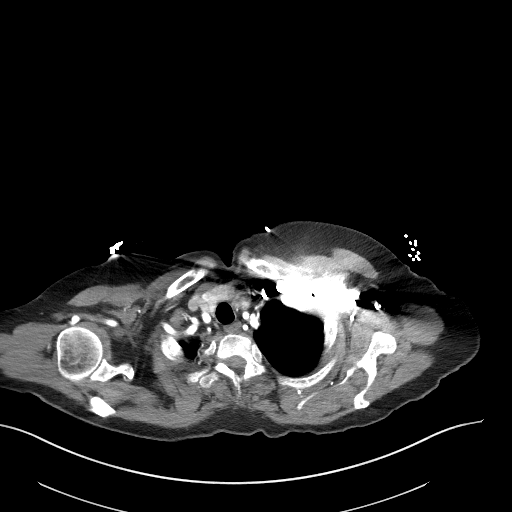

[Series 8: coronals · coronal · 0.81mm/px · 3 of 132 slices shown]
[im 33/132  soft-tissue]
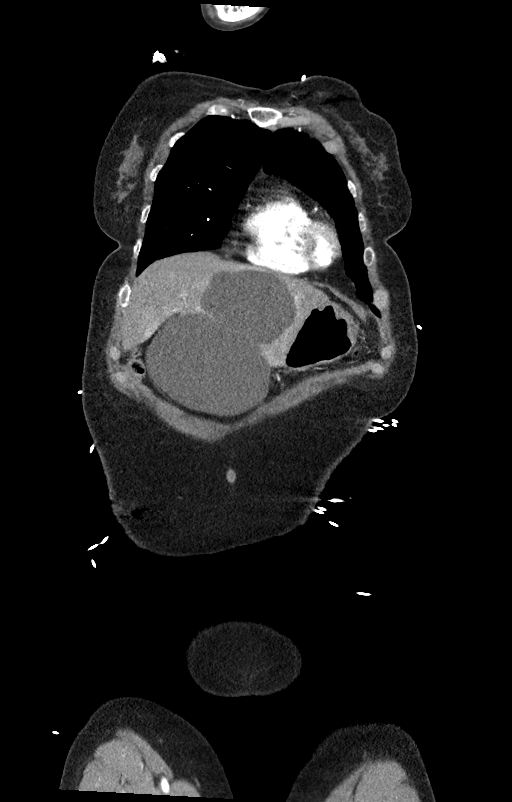
[im 66/132  soft-tissue]
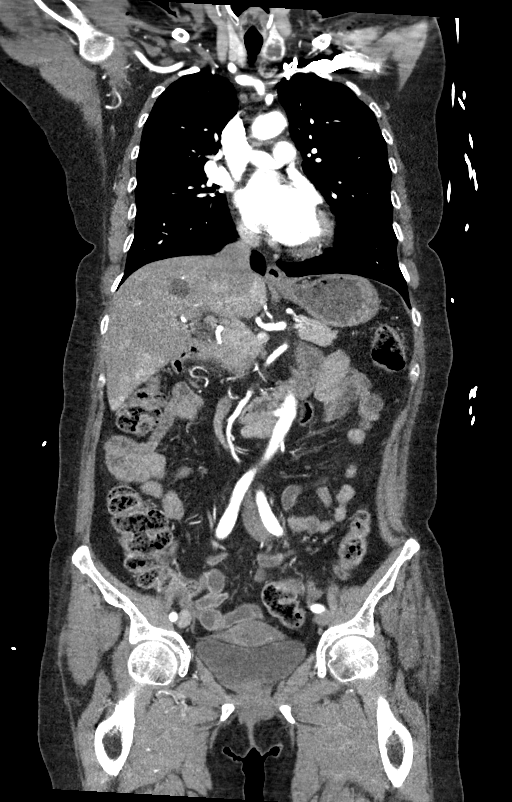
[im 99/132  soft-tissue]
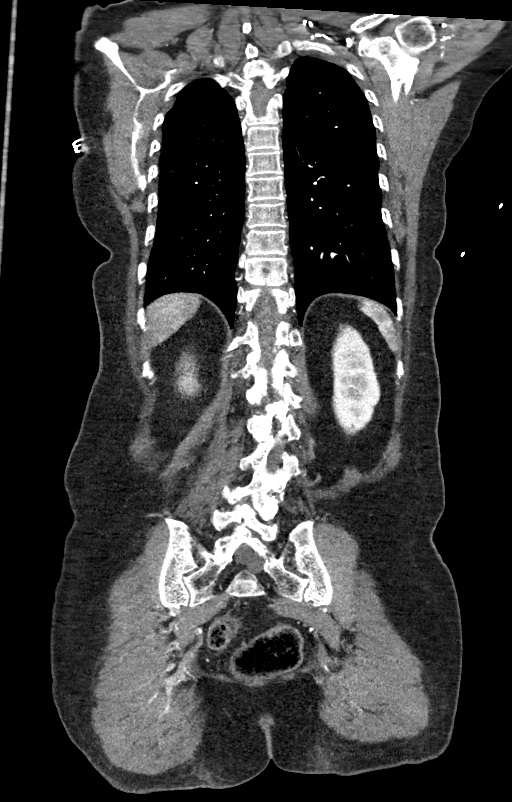

[14 of 46 positions shown; findings below may reference images not displayed]

Multidetector CT imaging through the chest, abdomen and pelvis was
performed using the standard protocol during bolus administration of
intravenous contrast. Multiplanar reconstructed images and MIPs were
obtained and reviewed to evaluate the vascular anatomy.

RADIATION DOSE REDUCTION: This exam was performed according to the
departmental dose-optimization program which includes automated
exposure control, adjustment of the mA and/or kV according to
patient size and/or use of iterative reconstruction technique.

CONTRAST:  80mL OMNIPAQUE IOHEXOL 350 MG/ML SOLN
FINDINGS: CTA CHEST FINDINGS

Cardiovascular: Satisfactory opacification of the pulmonary arteries
to the segmental level. No evidence of pulmonary embolism. Normal
heart size. No pericardial effusion.

Mediastinum/Nodes: 2 left thyroid nodules, the largest measuring up
to 2.2 cm.

Lungs/Pleura: There is no edema, consolidation, effusion, or
pneumothorax.

Musculoskeletal: Thoracic spondylosis.

Review of the MIP images confirms the above findings.

CTA ABDOMEN AND PELVIS FINDINGS

VASCULAR

Aorta: Normal caliber aorta without aneurysm, dissection, vasculitis
or significant stenosis.

Celiac: Unremarkable

SMA: Unremarkable

Renals: Accessory right upper pole renal artery. Vessels are
smoothly contoured and diffusely patent

IMA: Patent

Inflow: Widely patent and smoothly contoured. No aneurysm or
dissection

Veins: Unremarkable in the arterial phase

Review of the MIP images confirms the above findings.

NON-VASCULAR

Hepatobiliary: Heterogeneous density of the liver likely from
perfusion anomalies or areas of fatty sparing, but unusually patchy
and peripheral in nature. Large left hepatic cysts, largest
measuring nearly 11 cm. Dominant left hepatic cyst appear to be
adjacent separate cysts rather than a bilobed cyst. No evidence of
intracystic hemorrhage. Cholecystectomy without right upper quadrant
inflammation. Dilated common bile duct which could be reservoir
effect, measuring up to 1 cm in diameter.

Pancreas: Unremarkable.

Spleen: Unremarkable.

Adrenals/Urinary Tract: Negative adrenals. No hydronephrosis or
stone. Unremarkable bladder.

Stomach/Bowel:  No obstruction. No visible bowel inflammation

Lymphatic: No mass or adenopathy.

Reproductive:No pathologic findings.

Other: No ascites or pneumoperitoneum.

Musculoskeletal: No acute abnormalities. Lumbar spine degeneration
and levoscoliosis. Incomplete segmentation with hemivertebra on the
left at L3-4.

Review of the MIP images confirms the above findings.
IMPRESSION: 1. Negative for acute aortic syndrome. Normal appearance of the
aorta with notable lack of atheromatous changes for age.
2. Hepatic cysts measuring up to 11 cm in the left lobe. This cyst
deforms the capsule but no internal complexity/hemorrhage to explain
acute symptoms.
3. Heterogeneous liver which could be transient perfusion anomalies
or heterogeneous fat deposition, but an atypical pattern. Given the
history of epigastric pain, suggest elective MR characterization.
4. Lumbar scoliosis and degeneration associated with a left
hemivertebra.
5. Left thyroid nodules measuring up to 2.2 cm. Recommend thyroid
US.(Ref: [HOSPITAL]. [DATE]): 143-50).

## 2024-06-21 ENCOUNTER — Telehealth: Payer: Self-pay

## 2024-09-25 ENCOUNTER — Other Ambulatory Visit: Payer: Self-pay

## 2024-09-25 DIAGNOSIS — Z1231 Encounter for screening mammogram for malignant neoplasm of breast: Secondary | ICD-10-CM

## 2024-10-02 ENCOUNTER — Ambulatory Visit: Payer: Self-pay
